# Patient Record
Sex: Female | Born: 1995
Health system: Southern US, Community
[De-identification: ages and names within clinical notes are randomized; demographics above are authoritative.]

## PROBLEM LIST (undated history)

## (undated) DIAGNOSIS — F909 Attention-deficit hyperactivity disorder, unspecified type: Secondary | ICD-10-CM

---
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
## 2015-07-30 ENCOUNTER — Emergency Department
Admission: EM | Admit: 2015-07-30 | Discharge: 2015-07-30 | Disposition: A | Discharge status: Home / Self Care | Payer: Managed Care, Other (non HMO) | Attending: Emergency Medicine | Admitting: Emergency Medicine

## 2015-07-30 ENCOUNTER — Encounter: Payer: Self-pay | Admitting: Emergency Medicine

## 2015-07-30 DIAGNOSIS — F909 Attention-deficit hyperactivity disorder, unspecified type: Secondary | ICD-10-CM | POA: Insufficient documentation

## 2015-07-30 DIAGNOSIS — Y999 Unspecified external cause status: Secondary | ICD-10-CM | POA: Insufficient documentation

## 2015-07-30 DIAGNOSIS — Y929 Unspecified place or not applicable: Secondary | ICD-10-CM | POA: Insufficient documentation

## 2015-07-30 DIAGNOSIS — W57XXXA Bitten or stung by nonvenomous insect and other nonvenomous arthropods, initial encounter: Secondary | ICD-10-CM | POA: Insufficient documentation

## 2015-07-30 DIAGNOSIS — S80862A Insect bite (nonvenomous), left lower leg, initial encounter: Secondary | ICD-10-CM | POA: Diagnosis present

## 2015-07-30 DIAGNOSIS — Y939 Activity, unspecified: Secondary | ICD-10-CM | POA: Diagnosis not present

## 2015-07-30 DIAGNOSIS — L03116 Cellulitis of left lower limb: Secondary | ICD-10-CM | POA: Insufficient documentation

## 2015-07-30 HISTORY — DX: Attention-deficit hyperactivity disorder, unspecified type: F90.9

## 2015-07-30 MED ORDER — SULFAMETHOXAZOLE-TRIMETHOPRIM 800-160 MG PO TABS: 1.0000 | ORAL_TABLET | Freq: Two times a day (BID) | ORAL | Status: DC

## 2015-07-30 NOTE — Discharge Instructions (Signed)

## 2015-07-30 NOTE — ED Provider Notes (Signed)
[facility] Emergency Department Provider Note  ____________________________________________  Time seen: Approximately 3:44 PM  I have reviewed the triage vital signs and the nursing notes.   HISTORY  Chief Complaint Insect Bite   HPI Sydney Hall is a 20 y.o. female who presents to the emergency department for evaluation of redness to the left thigh. She states she felt something bite her yesterday, but didn't look at it until this morning. Through the day, the area has gotten larger and more tender. She denies history of skin infection.  Past Medical History  Diagnosis Date  . ADHD (attention deficit hyperactivity disorder)     There are no active problems to display for this patient.   History reviewed. No pertinent past surgical history.  Current Outpatient Rx  Name  Route  Sig  Dispense  Refill  . sulfamethoxazole-trimethoprim (BACTRIM DS,SEPTRA DS) 800-160 MG tablet   Oral   Take 1 tablet by mouth 2 (two) times daily.   20 tablet   0     Allergies Review of patient's allergies indicates no known allergies.  History reviewed. No pertinent family history.  Social History Social History  Substance Use Topics  . Smoking status: Never Smoker   . Smokeless tobacco: None  . Alcohol Use: No    Review of Systems  Constitutional: Negative for fever/chills Respiratory: Negative for shortness of breath. Musculoskeletal: Negative for pain. Skin: Positive for lesion and erythema Neurological: Negative for headaches, focal weakness or numbness. ____________________________________________   PHYSICAL EXAM:  VITAL SIGNS: ED Triage Vitals  Enc Vitals Group     BP 07/30/15 1505 133/79 mmHg     Pulse Rate 07/30/15 1505 89     Resp 07/30/15 1505 20     Temp 07/30/15 1505 97.7 F (36.5 C)     Temp Source 07/30/15 1505 Oral     SpO2 07/30/15 1505 100 %     Weight 07/30/15 1505 195 lb (88.451 kg)     Height 07/30/15 1505 5\' 9"  (1.753  m)     Head Cir --      Peak Flow --      Pain Score 07/30/15 1505 3     Pain Loc --      Pain Edu? --      Excl. in GC? --      Constitutional: Alert and oriented. Well appearing and in no acute distress. Eyes: Conjunctivae are normal. PERRL. EOMI. Nose: No congestion/rhinnorhea. Mouth/Throat: Mucous membranes are moist.   Neck: No stridor. Cardiovascular: Good peripheral circulation. Respiratory: Normal respiratory effort.  No retractions. Musculoskeletal: FROM throughout. Neurologic:  Normal speech and language. No gross focal neurologic deficits are appreciated. Skin:  Early cellulitis noted to the left lateral thigh. 6 cm area of erythema noted with a central pinpoint mark consistent with insect bite.  ____________________________________________   LABS (all labs ordered are listed, but only abnormal results are displayed)  Labs Reviewed - No data to display ____________________________________________  EKG   ____________________________________________  RADIOLOGY   ____________________________________________   PROCEDURES  Procedure(s) performed: None ____________________________________________   INITIAL IMPRESSION / ASSESSMENT AND PLAN / ED COURSE  Pertinent labs & imaging results that were available during my care of the patient were reviewed by me and considered in my medical decision making (see chart for details).  She will be advised to take Bactrim 2 times per day x 10 days.  She was advised to follow up with PCP in 2-3 days if not improving.  She was also advised to return to the emergency department for symptoms that change or worsen if unable to schedule an appointment.  ____________________________________________   FINAL CLINICAL IMPRESSION(S) / ED DIAGNOSES  Final diagnoses:  Cellulitis of left lower extremity       Chinita Pester, FNP 07/30/15 1559  Jene Every, MD 07/30/15 [phone]

## 2015-07-30 NOTE — ED Notes (Signed)
Pt to ed with c/o ? Spider bite to left thigh.  Pt with redness noted.  Pt reports mild pain.

## 2019-07-12 ENCOUNTER — Emergency Department: Payer: Managed Care, Other (non HMO)

## 2019-07-12 ENCOUNTER — Emergency Department
Admission: EM | Admit: 2019-07-12 | Discharge: 2019-07-13 | Disposition: A | Discharge status: Home / Self Care | Payer: Managed Care, Other (non HMO) | Attending: Emergency Medicine | Admitting: Emergency Medicine

## 2019-07-12 ENCOUNTER — Other Ambulatory Visit: Payer: Self-pay

## 2019-07-12 DIAGNOSIS — T783XXA Angioneurotic edema, initial encounter: Secondary | ICD-10-CM | POA: Diagnosis not present

## 2019-07-12 DIAGNOSIS — Z20822 Contact with and (suspected) exposure to covid-19: Secondary | ICD-10-CM | POA: Insufficient documentation

## 2019-07-12 DIAGNOSIS — Z79899 Other long term (current) drug therapy: Secondary | ICD-10-CM | POA: Diagnosis not present

## 2019-07-12 DIAGNOSIS — T7840XA Allergy, unspecified, initial encounter: Secondary | ICD-10-CM | POA: Insufficient documentation

## 2019-07-12 DIAGNOSIS — L299 Pruritus, unspecified: Secondary | ICD-10-CM | POA: Diagnosis present

## 2019-07-12 LAB — BASIC METABOLIC PANEL
Anion gap: 9 (ref 5–15)
BUN: 14 mg/dL (ref 6–20)
CO2: 23 mmol/L (ref 22–32)
Calcium: 8.8 mg/dL — ABNORMAL LOW (ref 8.9–10.3)
Chloride: 105 mmol/L (ref 98–111)
Creatinine, Ser: 0.88 mg/dL (ref 0.44–1.00)
GFR calc Af Amer: >60 mL/min (ref >60)
GFR calc non Af Amer: >60 mL/min (ref >60)
Glucose, Bld: 112 mg/dL — ABNORMAL HIGH (ref 70–99)
Potassium: 3.6 mmol/L (ref 3.5–5.1)
Sodium: 137 mmol/L (ref 135–145)

## 2019-07-12 LAB — CBC
HCT: 41.8 % (ref 36.0–46.0)
Hemoglobin: 14.3 g/dL (ref 12.0–15.0)
MCH: 29.1 pg (ref 26.0–34.0)
MCHC: 34.2 g/dL (ref 30.0–36.0)
MCV: 85.1 fL (ref 80.0–100.0)
Platelets: 233 10*3/uL (ref 150–400)
RBC: 4.91 MIL/uL (ref 3.87–5.11)
RDW: 12 % (ref 11.5–15.5)
WBC: 11.3 10*3/uL — ABNORMAL HIGH (ref 4.0–10.5)
nRBC: 0 % (ref 0.0–0.2)

## 2019-07-12 LAB — RESPIRATORY PANEL BY RT PCR (FLU A&B, COVID)
Influenza A by PCR: NEGATIVE
Influenza B by PCR: NEGATIVE
SARS Coronavirus 2 by RT PCR: NEGATIVE

## 2019-07-12 MED ORDER — SODIUM CHLORIDE 0.9 % IV BOLUS
1000.0000 mL | Freq: Once | INTRAVENOUS | Status: AC
  Administered 2019-07-12: 1000 mL via INTRAVENOUS

## 2019-07-12 MED ORDER — FAMOTIDINE IN NACL 20-0.9 MG/50ML-% IV SOLN
20.0000 mg | Freq: Once | INTRAVENOUS | Status: AC
  Administered 2019-07-12: 20 mg via INTRAVENOUS
  Filled 2019-07-12: qty 50

## 2019-07-12 MED ORDER — EPINEPHRINE 0.3 MG/0.3ML IJ SOAJ
0.3000 mg | Freq: Once | INTRAMUSCULAR | Status: AC
  Administered 2019-07-12: 21:00:00 0.3 mg via INTRAMUSCULAR
  Filled 2019-07-12: qty 0.3

## 2019-07-12 MED ORDER — METHYLPREDNISOLONE SODIUM SUCC 125 MG IJ SOLR
125.0000 mg | INTRAMUSCULAR | Status: AC
  Administered 2019-07-12: 125 mg via INTRAVENOUS
  Filled 2019-07-12: qty 2

## 2019-07-12 NOTE — ED Provider Notes (Signed)
[facility] Emergency Department Provider Note   ____________________________________________   First MD Initiated Contact with Patient 07/12/19 2041     (approximate)  I have reviewed the triage vital signs and the nursing notes.   HISTORY  Chief Complaint Allergic Reaction    HPI Sydney Hall is a 24 y.o. female here for evaluation for concerns of a possible allergic reaction  Patient reports that she went to brunch with her family in [place], on the ride home she began experiencing symptoms of itching and swelling of her eyelids.  Her lips started to seem to get swollen.  They went home and this evening she took 2 Benadryl and 2 Tylenol tablets.   He checked it temperature at home, and she had a temperature of about 100 Fahrenheit.  Thought they better come the ER as she seem to be experiencing symptoms of possible allergic reaction.  She also reports a bit of redness and had hives on her face earlier  At the ER while being triaged she reports she almost passed out.  She is feeling better now, but did feel at one point a scratchy throat as though her throat felt like it was closing which is improving and her lip swelling seems to have come down slightly.  She continues to feel fatigued, somewhat itchy has a bit of a red rash on her face and her lips are still slightly swollen  Has never had a severe allergic reaction before, but has had seasonal allergies and mother has a history of anaphylaxis    Past Medical History:  Diagnosis Date  . ADHD (attention deficit hyperactivity disorder)     There are no problems to display for this patient.   No past surgical history on file.  Prior to Admission medications   Medication Sig Start Date End Date Taking? Authorizing Provider  amphetamine-dextroamphetamine (ADDERALL) 15 MG tablet Take 1 tablet by mouth daily. 06/16/19   [provider]  EPINEPHrine 0.3 mg/0.3 mL IJ SOAJ injection  Inject 0.3 mLs (0.3 mg total) into the muscle once for 1 dose. 07/13/19 07/13/19  Delman Kitten, MD  JAIMIESS 0.15-0.03 &0.01 MG tablet Take 1 tablet by mouth daily. 06/08/19   [provider]  predniSONE (DELTASONE) 20 MG tablet Take 2 tablets (40 mg total) by mouth daily with breakfast. 07/13/19   Delman Kitten, MD    Allergies Patient has no known allergies.  No family history on file.  Social History Social History   Tobacco Use  . Smoking status: Never Smoker  Substance Use Topics  . Alcohol use: No  . Drug use: No    Review of Systems Constitutional: No fever/chills Eyes: No visual changes. ENT: Throat felt sore and scratchy and felt like it was closing earlier Cardiovascular: Denies chest pain. Respiratory: Did feel short of breath somewhat earlier seems to be getting better.  She has had a slight cough for about 7 days.  No known Covid exposure.  No body aches or fevers prior. Gastrointestinal: No abdominal pain.  Had some intermittent abdominal cramping around the time the symptoms started.  Denies pregnancy. Genitourinary: Negative for dysuria. Musculoskeletal: Negative for back pain. Skin: Slight red rash on her face, and had hives across her face earlier Neurological: Negative for headaches, areas of focal weakness or numbness.    ____________________________________________   PHYSICAL EXAM:  VITAL SIGNS: ED Triage Vitals  Enc Vitals Group     BP 07/12/19 2037 115/66     Pulse  Rate 07/12/19 2037 71     Resp 07/12/19 2037 20     Temp 07/12/19 2037 98.4 F (36.9 C)     Temp Source 07/12/19 2037 Oral     SpO2 07/12/19 2037 99 %     Weight 07/12/19 2034 200 lb (90.7 kg)     Height 07/12/19 2034 5\' 9"  (1.753 m)     Head Circumference --      Peak Flow --      Pain Score 07/12/19 2033 0     Pain Loc --      Pain Edu? --      Excl. in GC? --     Constitutional: Alert and oriented. Well appearing and in no acute distress.  She does appear to have some  slight facial erythema very fine likely resolving hives. Eyes: Conjunctivae are normal. Head: Atraumatic. Nose: No congestion/rhinnorhea. Mouth/Throat: Mucous membranes are moist.  The tongue is midline.  There is no intra oral edema no tongue edema.  Both of her lips demonstrate mild angioedema both the upper and lower lip. Neck: No stridor.  Cardiovascular: Normal rate, regular rhythm. Grossly normal heart sounds.  Good peripheral circulation. Respiratory: Normal respiratory effort.  No retractions. Lungs CTAB. Gastrointestinal: Soft and nontender. No distention. Musculoskeletal: No lower extremity tenderness nor edema. Neurologic:  Normal speech and language. No gross focal neurologic deficits are appreciated.  Skin:  Skin is warm, dry and intact. No rash noted. Psychiatric: Mood and affect are normal. Speech and behavior are normal.  ____________________________________________   LABS (all labs ordered are listed, but only abnormal results are displayed)  Labs Reviewed  CBC - Abnormal; Notable for the following components:      Result Value   WBC 11.3 (*)    All other components within normal limits  BASIC METABOLIC PANEL - Abnormal; Notable for the following components:   Glucose, Bld 112 (*)    Calcium 8.8 (*)    All other components within normal limits  RESPIRATORY PANEL BY RT PCR (FLU A&B, COVID)   ____________________________________________  EKG  Reviewed interpreted by me at 2045 Heart rate 60 QRS 99 QTc 450 Sinus rhythm, repolarization abnormality.  No evidence ischemia. ____________________________________________  RADIOLOGY  DG Chest Portable 1 View  Result Date: 07/12/2019 CLINICAL DATA:  Cough x1 week. EXAM: PORTABLE CHEST 1 VIEW COMPARISON:  None. FINDINGS: The heart size and mediastinal contours are within normal limits. Both lungs are clear. The visualized skeletal structures are unremarkable. IMPRESSION: No active disease. Electronically Signed   By:  09/11/2019 M.D.   On: 07/12/2019 21:19    Chest x-ray reviewed normal ____________________________________________   PROCEDURES  Procedure(s) performed: None  Procedures  Critical Care performed: Yes, see critical care note(s)  CRITICAL CARE Performed by: 09/11/2019   Total critical care time: 25 minutes  Critical care time was exclusive of separately billable procedures and treating other patients.  Critical care was necessary to treat or prevent imminent or life-threatening deterioration.  Critical care was time spent personally by me on the following activities: development of treatment plan with patient and/or surrogate as well as nursing, discussions with consultants, evaluation of patient's response to treatment, examination of patient, obtaining history from patient or surrogate, ordering and performing treatments and interventions, ordering and review of laboratory studies, ordering and review of radiographic studies, pulse oximetry and re-evaluation of patient's condition.  ____________________________________________   INITIAL IMPRESSION / ASSESSMENT AND PLAN / ED COURSE  Pertinent labs & imaging results  that were available during my care of the patient were reviewed by me and considered in my medical decision making (see chart for details).   Patient presents for evaluation sign symptoms seem very worrisome for possible significant allergic reaction with lip angioedema hives itching.  Trigger unclear.  Possible fever at home but afebrile in the ER, I personally checked her temp 98.6 here.  She denies any recent infectious symptoms slight dry cough for about a week.  Reassuring clinical exam.  No acute cardiac symptoms.  No hypotension, given epinephrine for concerns of angioedema and feeling she was having difficulty breathing.  Did have a near syncopal episode at triage however.  Clinical Course as of Jul 13 19  Wynelle Link Jul 12, 2019  2117 Patient resting, no  distress.  Felt little tremulous after receiving epinephrine, overall resting without worsening.  Appears stable   [MQ]  2208 Patient appears to be improving.  Lip swelling improving.  Rash improving.  She is resting comfortably at this time.   [MQ]    Clinical Course User Index [MQ] Sharyn Creamer, MD    ----------------------------------------- 12:23 AM on 07/13/2019 -----------------------------------------  Observe, resting comfortably.  Patient feels improved, ready to go home.  Will discharge to follow-up with primary care as well as recommendation to see ear nose and throat physician/allergy for allergy testing.  First is a scratch in the posterior oropharynx which is been present throughout.  Posterior pharynx no oropharyngeal edema.  No lingular edema.  Patient denies difficulty breathing.  Appears reassuring exam at this time.  Fully awake and alert.  Vital signs normal  Return precautions and treatment recommendations and follow-up discussed with the patient who is agreeable with the plan.  Patient's mother driving her home. ____________________________________________   FINAL CLINICAL IMPRESSION(S) / ED DIAGNOSES  Final diagnoses:  Allergic reaction, initial encounter  Angioedema, initial encounter        Note:  This document was prepared using Dragon voice recognition software and may include unintentional dictation errors       Sharyn Creamer, MD 07/13/19 [phone]

## 2019-07-12 NOTE — ED Notes (Signed)
Pt ambulated, steady gait, no assistance needed.

## 2019-07-12 NOTE — ED Triage Notes (Signed)
Allergic reaction to unknown source.  Reports noticed symptoms around 5 pm.

## 2019-07-13 MED ORDER — PREDNISONE 20 MG PO TABS: 40.0000 mg | ORAL_TABLET | Freq: Every day | ORAL | 0 refills | Status: AC

## 2019-07-13 MED ORDER — EPINEPHRINE 0.3 MG/0.3ML IJ SOAJ: 0.3000 mg | Freq: Once | INTRAMUSCULAR | 1 refills | Status: AC

## 2019-07-13 NOTE — Discharge Instructions (Signed)

## 2019-10-21 ENCOUNTER — Other Ambulatory Visit: Payer: Self-pay

## 2022-05-03 IMAGING — DX DG CHEST 1V PORT
1 series · 2 of 2 positions shown · non-contrast
Comparison: None.

CLINICAL DATA: Cough x1 week.

EXAM:
PORTABLE CHEST 1 VIEW

[Series 1: chest ap · 0.14mm/px · 2 of 2 slices shown]
[im 1/2]
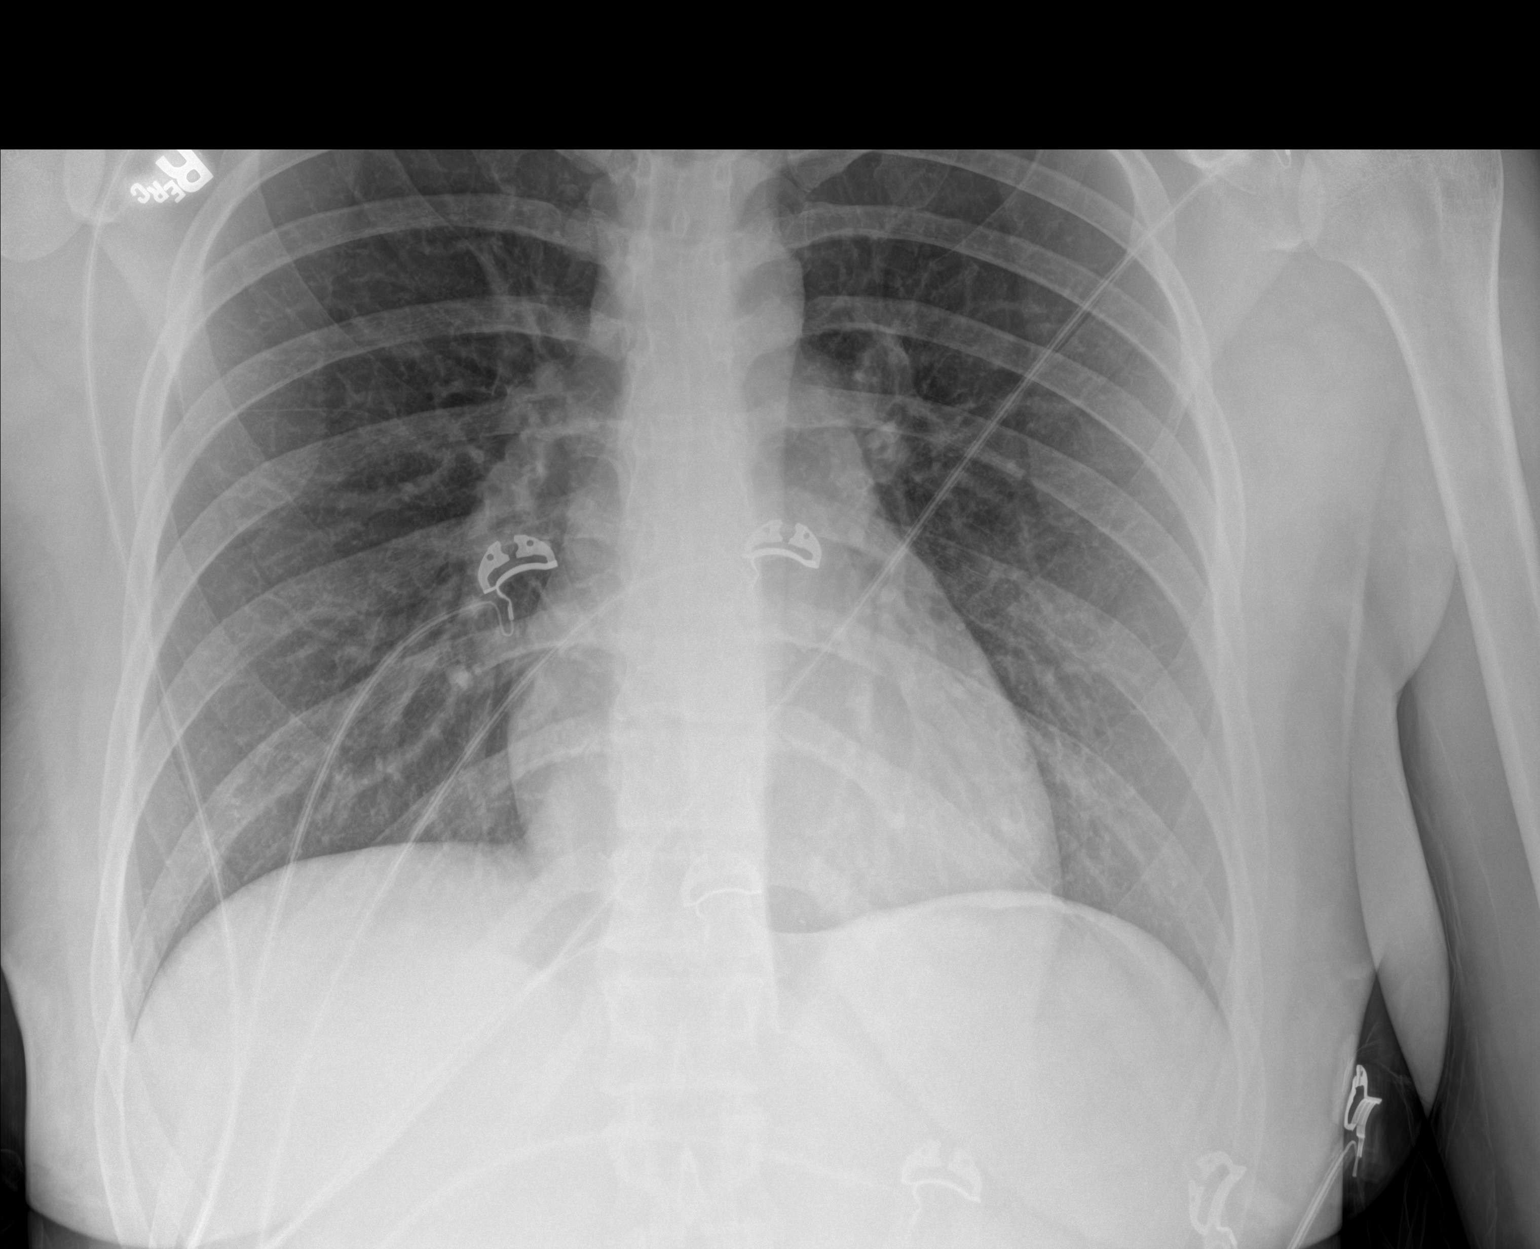
[im 2/2]
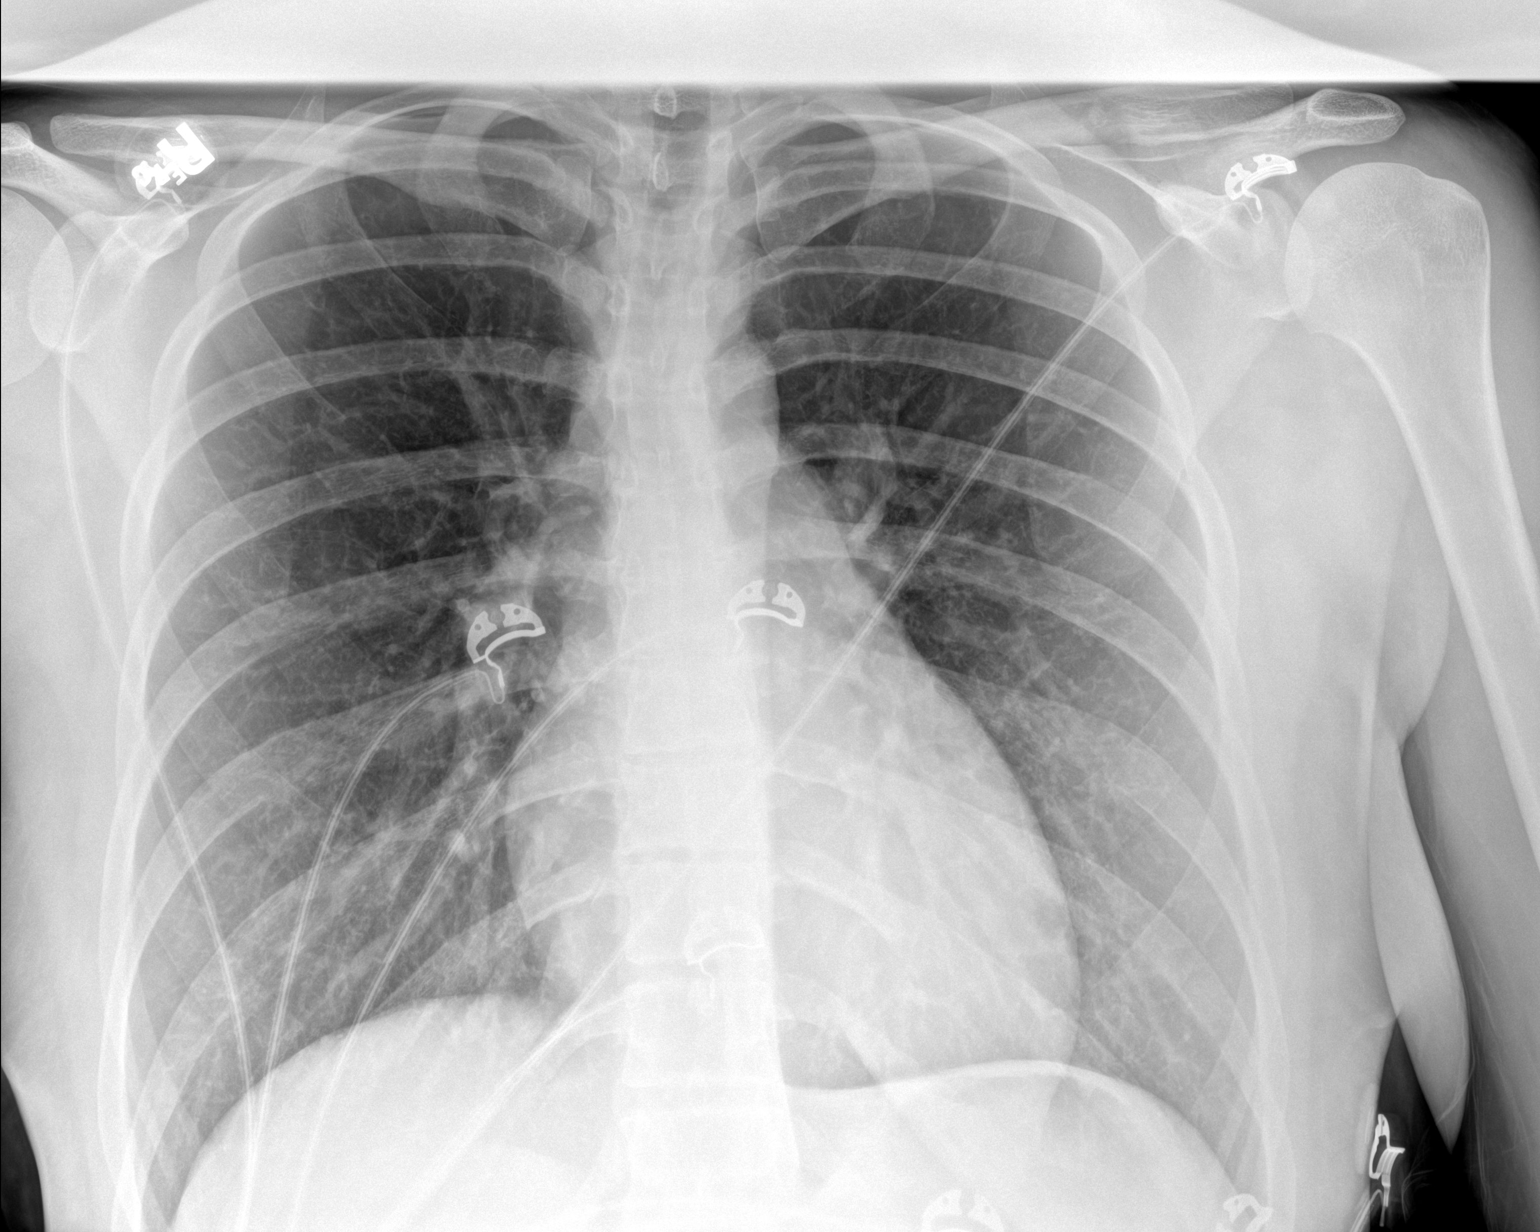

[2 of 2 positions shown; findings below may reference images not displayed]

FINDINGS: The heart size and mediastinal contours are within normal limits.
Both lungs are clear. The visualized skeletal structures are
unremarkable.
IMPRESSION: No active disease.
